# Patient Record
Sex: Male | Born: 1985 | Race: White | Hispanic: No | Marital: Single | Smoking: Never smoker
Health system: Southern US, Community
[De-identification: ages and names within clinical notes are randomized; demographics above are authoritative.]

## PROBLEM LIST (undated history)

## (undated) DIAGNOSIS — M1990 Unspecified osteoarthritis, unspecified site: Secondary | ICD-10-CM

## (undated) HISTORY — DX: Unspecified osteoarthritis, unspecified site: M19.90

## (undated) HISTORY — PX: APPENDECTOMY: SHX54

---

## 2008-04-19 ENCOUNTER — Ambulatory Visit: Admission: RE | Admit: 2008-04-19 | Discharge: 2008-04-19 | Payer: Self-pay | Admitting: Family Medicine

## 2008-12-16 ENCOUNTER — Encounter: Admission: RE | Admit: 2008-12-16 | Discharge: 2008-12-16 | Payer: Self-pay | Admitting: Specialist

## 2009-08-14 ENCOUNTER — Encounter: Admission: RE | Admit: 2009-08-14 | Discharge: 2009-08-14 | Payer: Self-pay | Admitting: Emergency Medicine

## 2009-11-03 ENCOUNTER — Encounter: Admission: RE | Admit: 2009-11-03 | Discharge: 2009-11-03 | Payer: Self-pay | Admitting: Emergency Medicine

## 2012-01-06 IMAGING — CT CT HEAD W/O CM
2 series · 16 of 30 positions shown, 18 images · non-contrast
Comparison: CT brain scan of 04/19/2008

CLINICAL DATA: Headaches following compression while snow boarding,
positive loss of consciousness, some nausea and lightheadedness

CT HEAD WITHOUT CONTRAST
TECHNIQUE: Contiguous axial images were obtained from the base of
the skull through the vertex without contrast.

[Series 2: head w/o · axial · non-contrast · 0.49mm/px · z∈[+173,+283]mm · 8 of 30 slices shown, 10 images]
[im 4/30  brain]
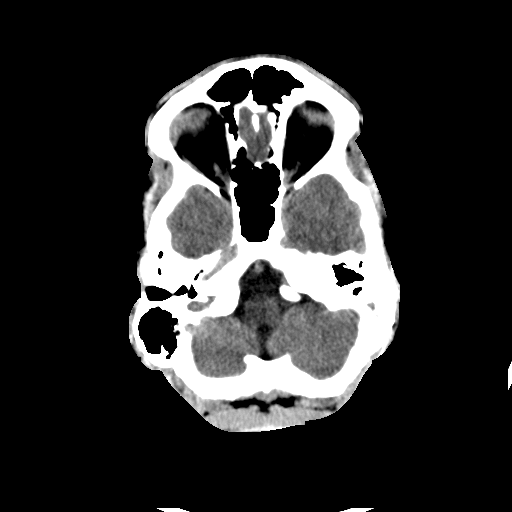
[im 4/30  bone]
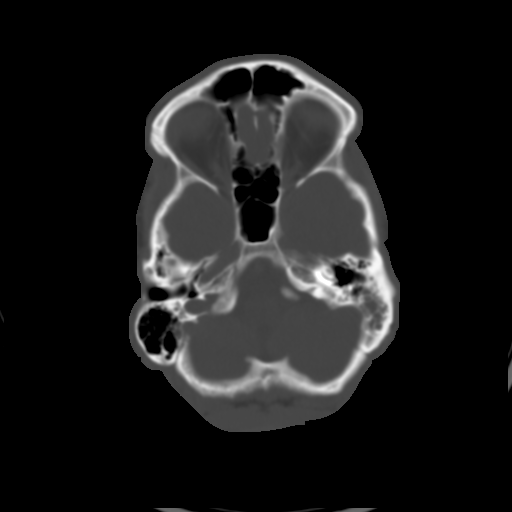
[im 7/30  brain]
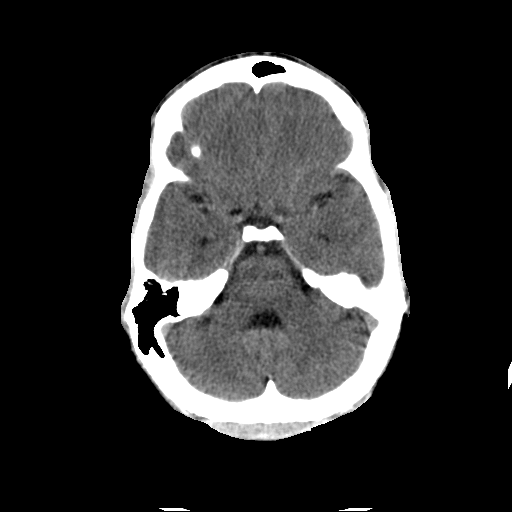
[im 10/30  brain]
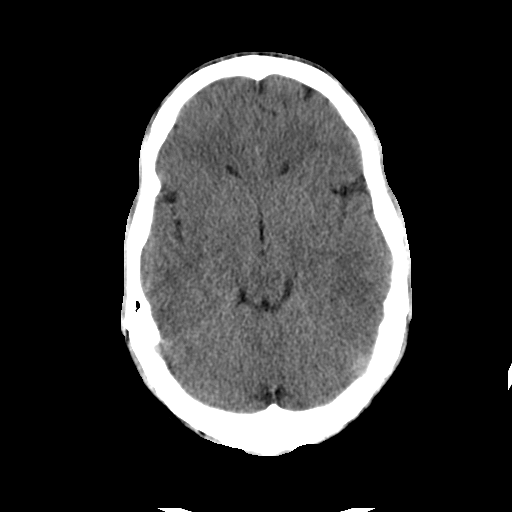
[im 13/30  brain]
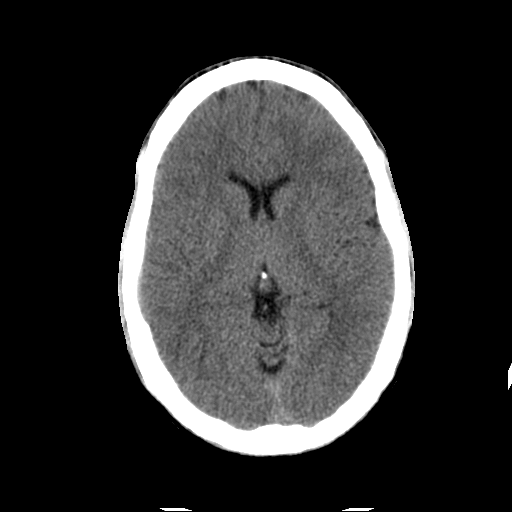
[im 17/30  brain]
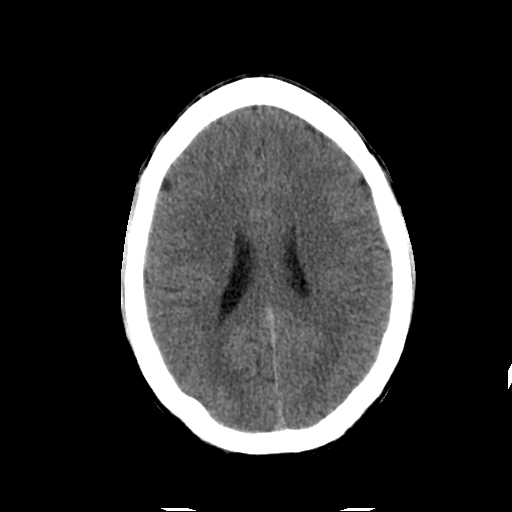
[im 17/30  bone]
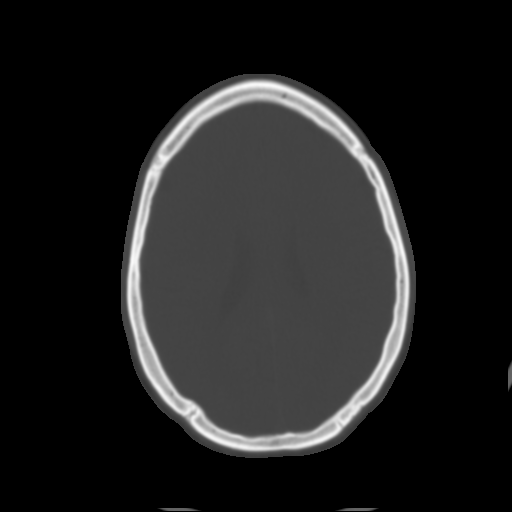
[im 20/30  brain]
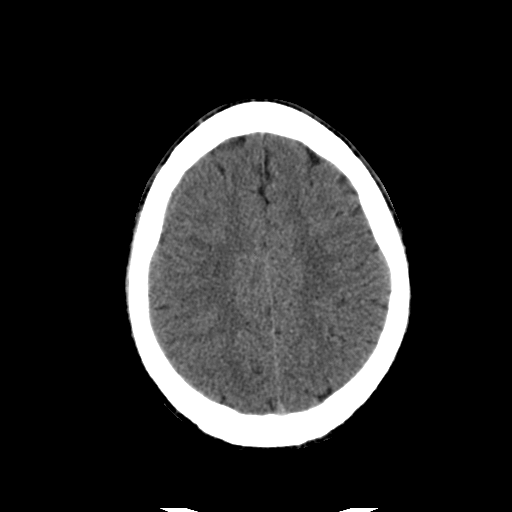
[im 23/30  brain]
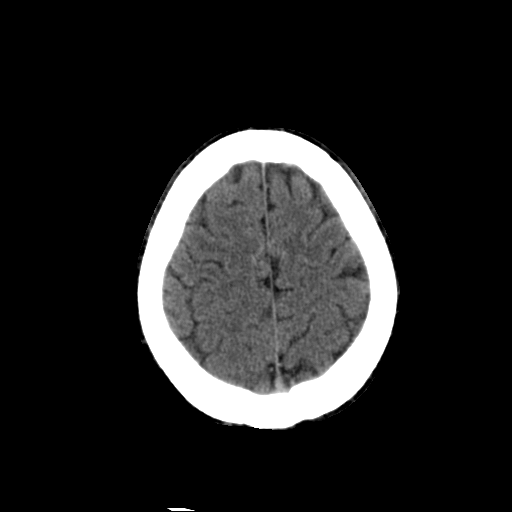
[im 26/30  brain]
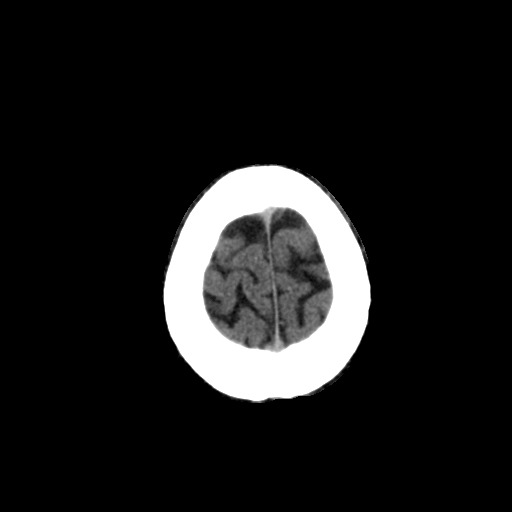

[Series 3: head bone · axial · 0.49mm/px · z∈[+172,+287]mm · 8 of 60 slices shown]
[im 7/60  bone]
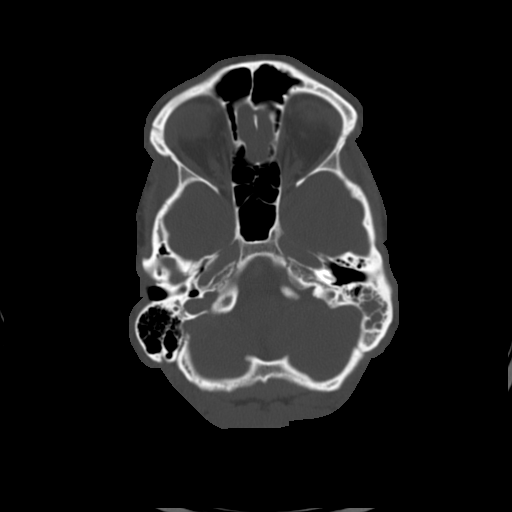
[im 13/60  bone]
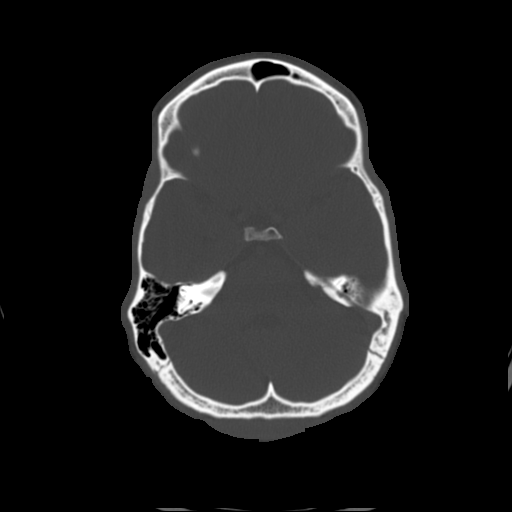
[im 19/60  bone]
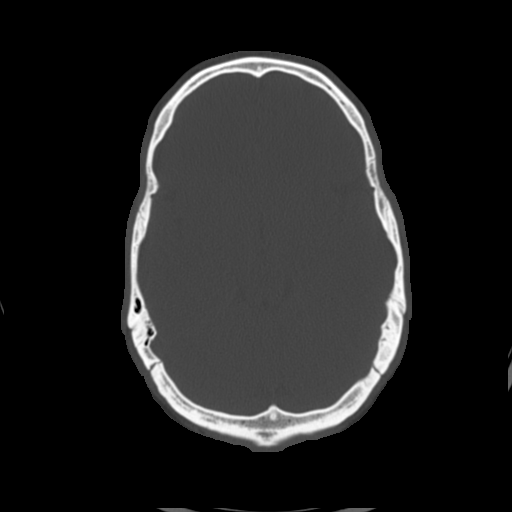
[im 25/60  bone]
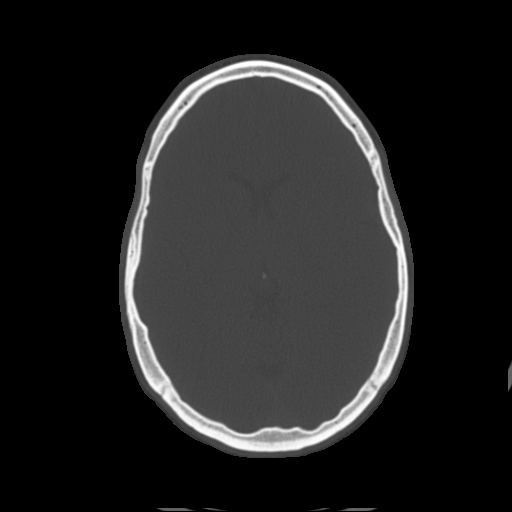
[im 35/60  bone]
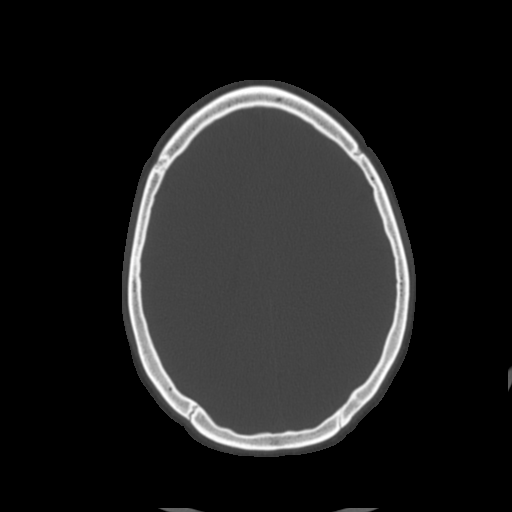
[im 41/60  bone]
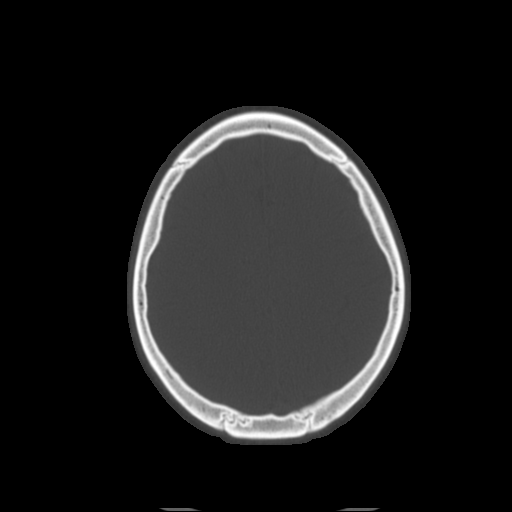
[im 47/60  bone]
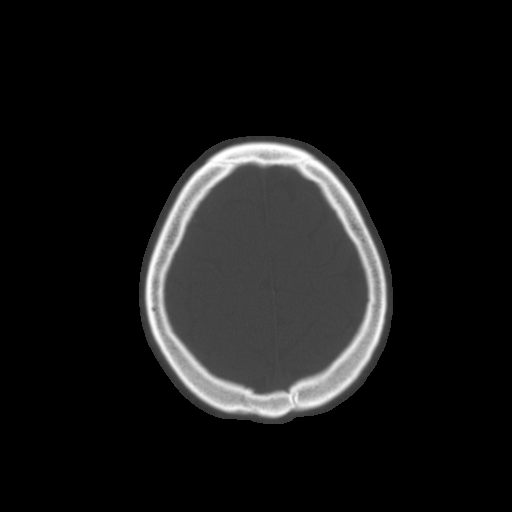
[im 53/60  bone]
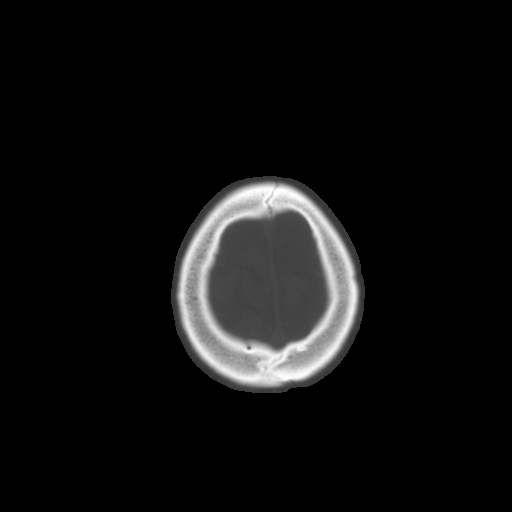

[16 of 30 positions shown; findings below may reference images not displayed]

FINDINGS: The ventricular system is normal in size and
configuration, and the septum remains in a normal midline position.
The fourth ventricle and basilar cisterns appear normal.  No
hemorrhage, mass lesion, or acute infarction is seen.  There is
some opacification of left mastoid air cells consistent with
chronic left mastoiditis.  The paranasal sinuses are clear.  No
acute calvarial abnormality is seen.
IMPRESSION: 1.  No acute intracranial abnormality.
2.  Probable chronic left mastoiditis.

I discussed this study with Dr. Jn Louis at the time of interpretation.

## 2013-12-26 ENCOUNTER — Ambulatory Visit (INDEPENDENT_AMBULATORY_CARE_PROVIDER_SITE_OTHER): Payer: BC Managed Care – PPO | Admitting: Family Medicine

## 2013-12-26 VITALS — BP 120/70 | HR 107 | Temp 98.7°F | Resp 17 | Ht 72.0 in | Wt 177.0 lb

## 2013-12-26 DIAGNOSIS — Z7185 Encounter for immunization safety counseling: Secondary | ICD-10-CM

## 2013-12-26 DIAGNOSIS — Z7189 Other specified counseling: Secondary | ICD-10-CM

## 2013-12-26 DIAGNOSIS — Z23 Encounter for immunization: Secondary | ICD-10-CM

## 2013-12-26 DIAGNOSIS — Z789 Other specified health status: Secondary | ICD-10-CM

## 2013-12-26 MED ORDER — HEPATITIS A VACCINE 1440 EL U/ML IM SUSP: 1.0000 mL | Freq: Once | INTRAMUSCULAR | Status: DC

## 2013-12-26 NOTE — Progress Notes (Signed)
° °  Subjective:    Patient ID: Christian Christian, male    DOB: 12-18-1985, 28 y.o.   MRN: [id number]  HPI This chart was scribed for Elvina SidleKurt Lauenstein, MD by Andrew Auaven Small, ED Scribe. This patient was seen in room 4 and the patient's care was started at 10:47 AM.  HPI Comments: Christian Christian is a 28 y.o. male who presents to the [facility] requesting to update his immunizations. Pt reports that he is moving to [place] for work in May for his job. Pt is an Electrical engineerinvestment banker. Pt is requesting Hep A and chickenpox vaccine. Pt reports that he received the Hep B vaccine when he was in 6th grade. Pt reports that he is healthy otherwise.    Past Medical History  Diagnosis Date   Arthritis    No Known Allergies Prior to Admission medications   Medication Sig Start Date End Date Taking? Authorizing Provider  methylphenidate (RITALIN) 20 MG tablet Take 20 mg by mouth 2 (two) times daily.   Yes Historical Provider, MD    Review of Systems  Constitutional: Negative for fever and chills.       Objective:   Physical Exam  Nursing note and vitals reviewed. Constitutional: He is oriented to person, place, and time. He appears well-developed and well-nourished. No distress.  HENT:  Head: Normocephalic and atraumatic.  Eyes: EOM are normal.  Neck: Neck supple.  Cardiovascular: Normal rate.   Musculoskeletal: Normal range of motion.  Neurological: He is alert and oriented to person, place, and time.  Skin: Skin is warm and dry.  Psychiatric: He has a normal mood and affect. His behavior is normal.       Assessment & Plan:    H/O foreign travel - Plan: hepatitis A virus (PF) vaccine (HAVRIX (PF)) 1440 EL U/ML injection 1,440 Units, Varicella zoster antibody, IgG  Immunization counseling - Plan: hepatitis A virus (PF) vaccine (HAVRIX (PF)) 1440 EL U/ML injection 1,440 Units, Varicella zoster antibody, IgG  Signed, Elvina SidleKurt Lauenstein, MD

## 2013-12-28 LAB — VARICELLA ZOSTER ANTIBODY, IGG: Varicella IgG: 2329 Index — ABNORMAL HIGH (ref <135.00)

## 2014-02-14 ENCOUNTER — Ambulatory Visit (INDEPENDENT_AMBULATORY_CARE_PROVIDER_SITE_OTHER): Payer: BC Managed Care – PPO | Admitting: Physician Assistant

## 2014-02-14 VITALS — BP 106/72 | HR 95 | Temp 98.4°F | Resp 18 | Ht 71.5 in | Wt 174.0 lb

## 2014-02-14 DIAGNOSIS — R05 Cough: Secondary | ICD-10-CM

## 2014-02-14 DIAGNOSIS — R059 Cough, unspecified: Secondary | ICD-10-CM

## 2014-02-14 DIAGNOSIS — J029 Acute pharyngitis, unspecified: Secondary | ICD-10-CM

## 2014-02-14 MED ORDER — METHYLPREDNISOLONE (PAK) 4 MG PO TABS: ORAL_TABLET | ORAL | Status: AC

## 2014-02-14 MED ORDER — BENZONATATE 100 MG PO CAPS
100.0000 mg | ORAL_CAPSULE | Freq: Three times a day (TID) | ORAL | Status: AC | PRN
Start: 2014-02-14 — End: ?

## 2014-02-14 MED ORDER — AZITHROMYCIN 250 MG PO TABS: ORAL_TABLET | ORAL | Status: AC

## 2014-02-14 NOTE — Progress Notes (Signed)
   Subjective:    Patient ID: Christian Christian, male    DOB: 1986-10-02, 28 y.o.   MRN: [id number]  HPI 28 year old male presents for evaluation of 1 month history of cough. States it has occurred intermittently but does seem to be more frequent now.  Also complains of sore, scratchy throat x 1 week. Has been doing a lot of traveling for work and believes he has picked something up in the airport. Has slight nasal congestion, rhinorrhea, and PND. Denies fever, chills, wheezing, hemoptysis, headache, or otalgia. Does not have SOB but does get winded more easily when exercising.   Patent is otherwise healthy with no other concerns today. No hx of asthma. Does have a hx of seasonal allergies treated with Claritin which normally works well.     Review of Systems  Constitutional: Negative for fever and chills.  HENT: Positive for postnasal drip, rhinorrhea and sore throat. Negative for congestion, sinus pressure and trouble swallowing.   Respiratory: Negative for wheezing.   Cardiovascular: Negative for chest pain.  Gastrointestinal: Negative for nausea and vomiting.  Neurological: Negative for dizziness and headaches.       Objective:   Physical Exam  Constitutional: He is oriented to person, place, and time. He appears well-developed and well-nourished.  HENT:  Head: Normocephalic and atraumatic.  Right Ear: Hearing, tympanic membrane, external ear and ear canal normal.  Left Ear: Hearing, tympanic membrane, external ear and ear canal normal.  Mouth/Throat: Uvula is midline, oropharynx is clear and moist and mucous membranes are normal.  Eyes: Conjunctivae are normal.  Neck: Normal range of motion. Neck supple.  Cardiovascular: Normal rate, regular rhythm and normal heart sounds.   Pulmonary/Chest: Effort normal and breath sounds normal.  Lymphadenopathy:    He has no cervical adenopathy.  Neurological: He is alert and oriented to person, place, and time.  Psychiatric: He has a normal mood  and affect. His behavior is normal. Judgment and thought content normal.          Assessment & Plan:  Cough - Plan: azithromycin (ZITHROMAX) 250 MG tablet, methylPREDNIsolone (MEDROL DOSPACK) 4 MG tablet, benzonatate (TESSALON) 100 MG capsule  Acute pharyngitis  Will treat with Zpack and medrol dose pack as directed.  Tessalon perles 100-200 mg tid prn cough Increase fluids. Continue Claritin daily Follow up if symptoms worsening or fail to improve.
